# Patient Record
Sex: Female | Born: 1950 | Race: White | Marital: Married
Health system: Southern US, Community
[De-identification: ages and names within clinical notes are randomized; demographics above are authoritative.]

---

## 2019-09-24 ENCOUNTER — Other Ambulatory Visit: Payer: Self-pay

## 2019-09-24 DIAGNOSIS — Z20822 Contact with and (suspected) exposure to covid-19: Secondary | ICD-10-CM

## 2019-09-25 LAB — NOVEL CORONAVIRUS, NAA: SARS-CoV-2, NAA: NOT DETECTED

## 2019-12-04 ENCOUNTER — Other Ambulatory Visit: Payer: Self-pay

## 2019-12-04 DIAGNOSIS — Z20822 Contact with and (suspected) exposure to covid-19: Secondary | ICD-10-CM

## 2019-12-05 LAB — NOVEL CORONAVIRUS, NAA: SARS-CoV-2, NAA: NOT DETECTED

## 2019-12-09 ENCOUNTER — Telehealth: Payer: Self-pay | Admitting: *Deleted

## 2019-12-09 NOTE — Telephone Encounter (Signed)
Reviewed negative Covid19 results with the patient. No further questions. 

## 2020-09-06 ENCOUNTER — Ambulatory Visit
Admission: RE | Admit: 2020-09-06 | Discharge: 2020-09-06 | Disposition: A | Discharge status: Home / Self Care | Payer: Medicare Other | Source: Ambulatory Visit | Attending: Family Medicine | Admitting: Family Medicine

## 2020-09-06 ENCOUNTER — Other Ambulatory Visit: Payer: Self-pay | Admitting: Family Medicine

## 2020-09-06 DIAGNOSIS — M25531 Pain in right wrist: Secondary | ICD-10-CM

## 2021-01-05 DIAGNOSIS — Z833 Family history of diabetes mellitus: Secondary | ICD-10-CM | POA: Diagnosis not present

## 2021-01-05 DIAGNOSIS — Z1211 Encounter for screening for malignant neoplasm of colon: Secondary | ICD-10-CM | POA: Diagnosis not present

## 2021-01-05 DIAGNOSIS — Z79899 Other long term (current) drug therapy: Secondary | ICD-10-CM | POA: Diagnosis not present

## 2021-01-05 DIAGNOSIS — E7800 Pure hypercholesterolemia, unspecified: Secondary | ICD-10-CM | POA: Diagnosis not present

## 2021-01-05 DIAGNOSIS — D692 Other nonthrombocytopenic purpura: Secondary | ICD-10-CM | POA: Diagnosis not present

## 2021-01-05 DIAGNOSIS — I10 Essential (primary) hypertension: Secondary | ICD-10-CM | POA: Diagnosis not present

## 2021-01-05 DIAGNOSIS — R7301 Impaired fasting glucose: Secondary | ICD-10-CM | POA: Diagnosis not present

## 2021-01-05 DIAGNOSIS — E559 Vitamin D deficiency, unspecified: Secondary | ICD-10-CM | POA: Diagnosis not present

## 2021-01-05 DIAGNOSIS — Z0000 Encounter for general adult medical examination without abnormal findings: Secondary | ICD-10-CM | POA: Diagnosis not present

## 2021-02-08 DIAGNOSIS — Z1211 Encounter for screening for malignant neoplasm of colon: Secondary | ICD-10-CM | POA: Diagnosis not present

## 2021-05-25 DIAGNOSIS — R509 Fever, unspecified: Secondary | ICD-10-CM | POA: Diagnosis not present

## 2021-05-25 DIAGNOSIS — J988 Other specified respiratory disorders: Secondary | ICD-10-CM | POA: Diagnosis not present

## 2021-05-26 DIAGNOSIS — Z20822 Contact with and (suspected) exposure to covid-19: Secondary | ICD-10-CM | POA: Diagnosis not present

## 2021-05-26 DIAGNOSIS — R509 Fever, unspecified: Secondary | ICD-10-CM | POA: Diagnosis not present

## 2021-06-14 DIAGNOSIS — N3000 Acute cystitis without hematuria: Secondary | ICD-10-CM | POA: Diagnosis not present

## 2021-06-14 DIAGNOSIS — R5383 Other fatigue: Secondary | ICD-10-CM | POA: Diagnosis not present

## 2021-06-30 DIAGNOSIS — R5383 Other fatigue: Secondary | ICD-10-CM | POA: Diagnosis not present

## 2021-06-30 DIAGNOSIS — N3000 Acute cystitis without hematuria: Secondary | ICD-10-CM | POA: Diagnosis not present

## 2021-10-05 DIAGNOSIS — H5213 Myopia, bilateral: Secondary | ICD-10-CM | POA: Diagnosis not present

## 2021-10-05 DIAGNOSIS — H25013 Cortical age-related cataract, bilateral: Secondary | ICD-10-CM | POA: Diagnosis not present

## 2021-10-05 DIAGNOSIS — H524 Presbyopia: Secondary | ICD-10-CM | POA: Diagnosis not present

## 2021-10-12 DIAGNOSIS — I10 Essential (primary) hypertension: Secondary | ICD-10-CM | POA: Diagnosis not present

## 2021-12-11 DIAGNOSIS — J069 Acute upper respiratory infection, unspecified: Secondary | ICD-10-CM | POA: Diagnosis not present

## 2021-12-11 DIAGNOSIS — U071 COVID-19: Secondary | ICD-10-CM | POA: Diagnosis not present

## 2021-12-26 DIAGNOSIS — Z20822 Contact with and (suspected) exposure to covid-19: Secondary | ICD-10-CM | POA: Diagnosis not present

## 2022-01-19 DIAGNOSIS — J0140 Acute pansinusitis, unspecified: Secondary | ICD-10-CM | POA: Diagnosis not present

## 2022-02-20 DIAGNOSIS — Z1231 Encounter for screening mammogram for malignant neoplasm of breast: Secondary | ICD-10-CM | POA: Diagnosis not present

## 2022-02-20 DIAGNOSIS — Z780 Asymptomatic menopausal state: Secondary | ICD-10-CM | POA: Diagnosis not present

## 2022-05-25 DIAGNOSIS — D649 Anemia, unspecified: Secondary | ICD-10-CM | POA: Diagnosis not present

## 2022-05-25 DIAGNOSIS — Z79899 Other long term (current) drug therapy: Secondary | ICD-10-CM | POA: Diagnosis not present

## 2022-05-25 DIAGNOSIS — Z0000 Encounter for general adult medical examination without abnormal findings: Secondary | ICD-10-CM | POA: Diagnosis not present

## 2022-05-25 DIAGNOSIS — M545 Low back pain, unspecified: Secondary | ICD-10-CM | POA: Diagnosis not present

## 2022-05-25 DIAGNOSIS — R7303 Prediabetes: Secondary | ICD-10-CM | POA: Diagnosis not present

## 2022-05-25 DIAGNOSIS — Z1211 Encounter for screening for malignant neoplasm of colon: Secondary | ICD-10-CM | POA: Diagnosis not present

## 2022-05-25 DIAGNOSIS — E559 Vitamin D deficiency, unspecified: Secondary | ICD-10-CM | POA: Diagnosis not present

## 2022-05-25 DIAGNOSIS — I10 Essential (primary) hypertension: Secondary | ICD-10-CM | POA: Diagnosis not present

## 2022-05-25 DIAGNOSIS — E7800 Pure hypercholesterolemia, unspecified: Secondary | ICD-10-CM | POA: Diagnosis not present

## 2022-05-25 DIAGNOSIS — N3281 Overactive bladder: Secondary | ICD-10-CM | POA: Diagnosis not present

## 2022-06-20 DIAGNOSIS — J0100 Acute maxillary sinusitis, unspecified: Secondary | ICD-10-CM | POA: Diagnosis not present

## 2022-07-13 IMAGING — CR DG WRIST COMPLETE 3+V*R*
4 series · 4 of 4 positions shown · non-contrast
Comparison: None.

CLINICAL DATA: Pain.  Injury 2 months prior

EXAM:
RIGHT WRIST - COMPLETE 3+ VIEW

[x wrist pa right]
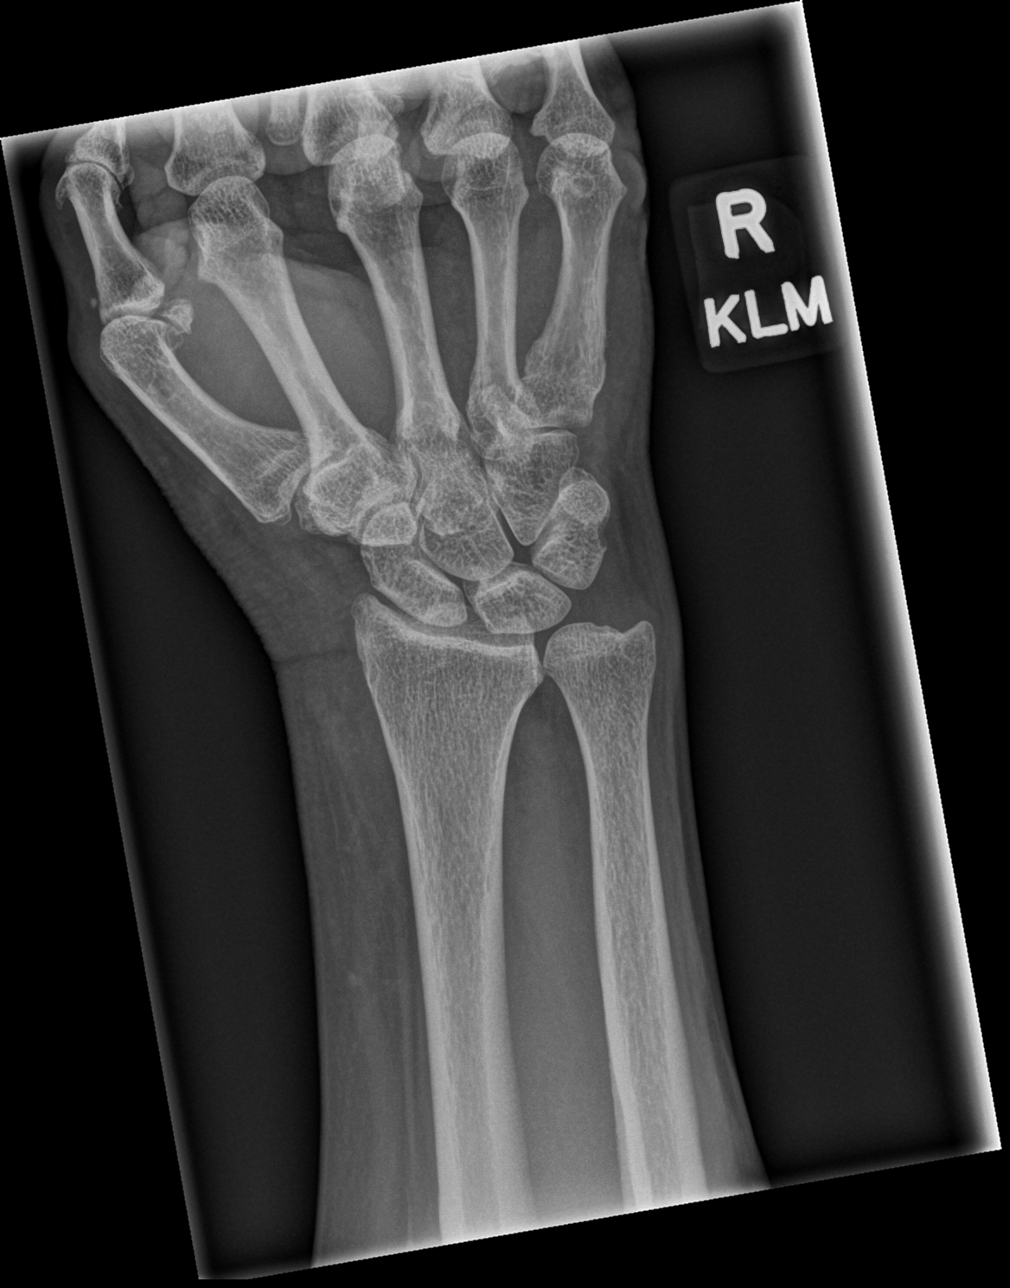

[x wrist obl right]
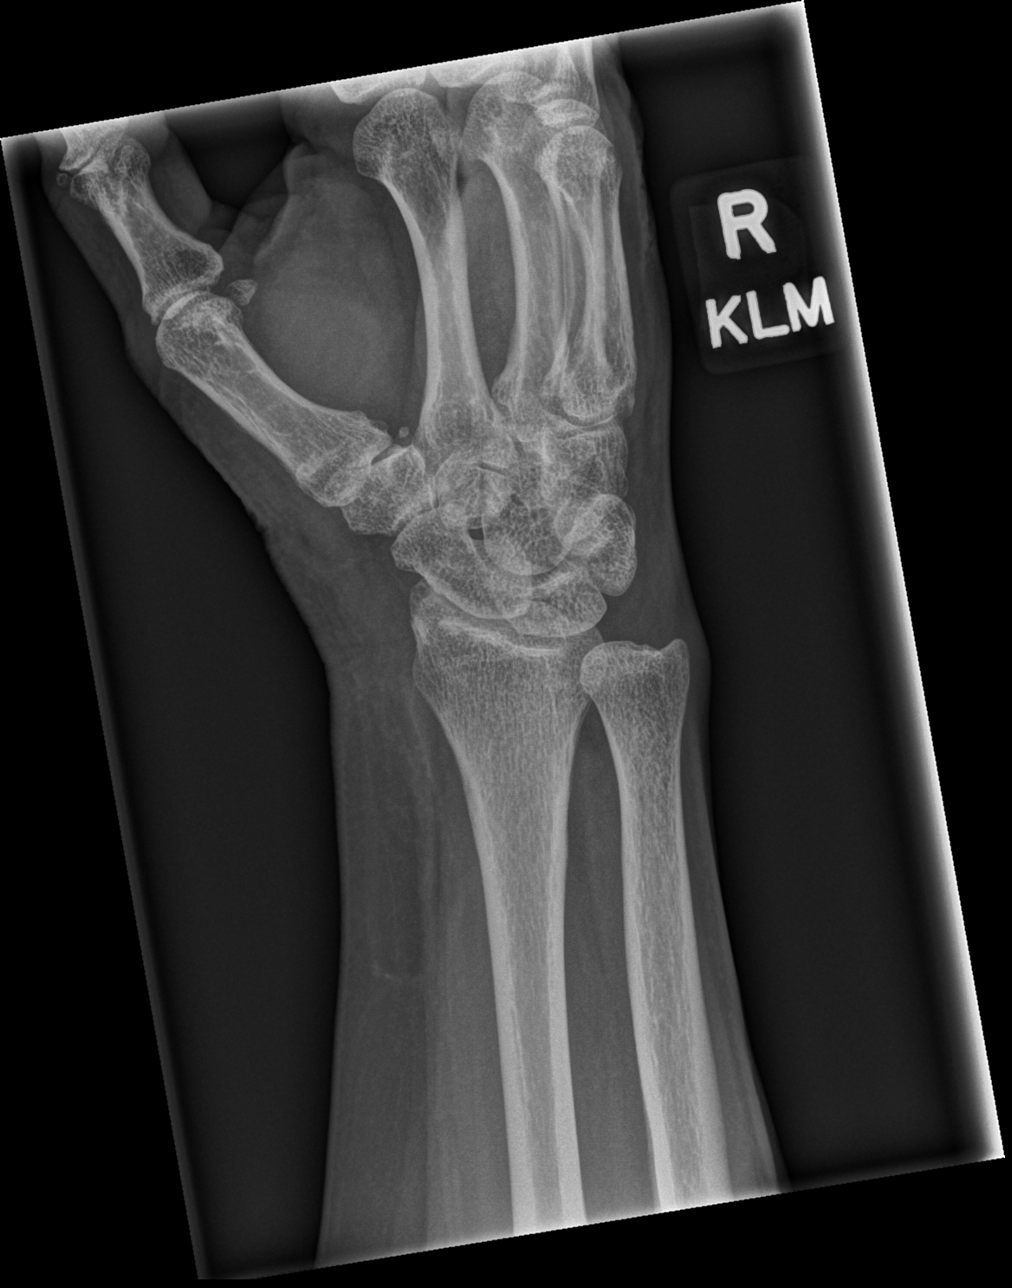

[x wrist lat right (1 of 2)]
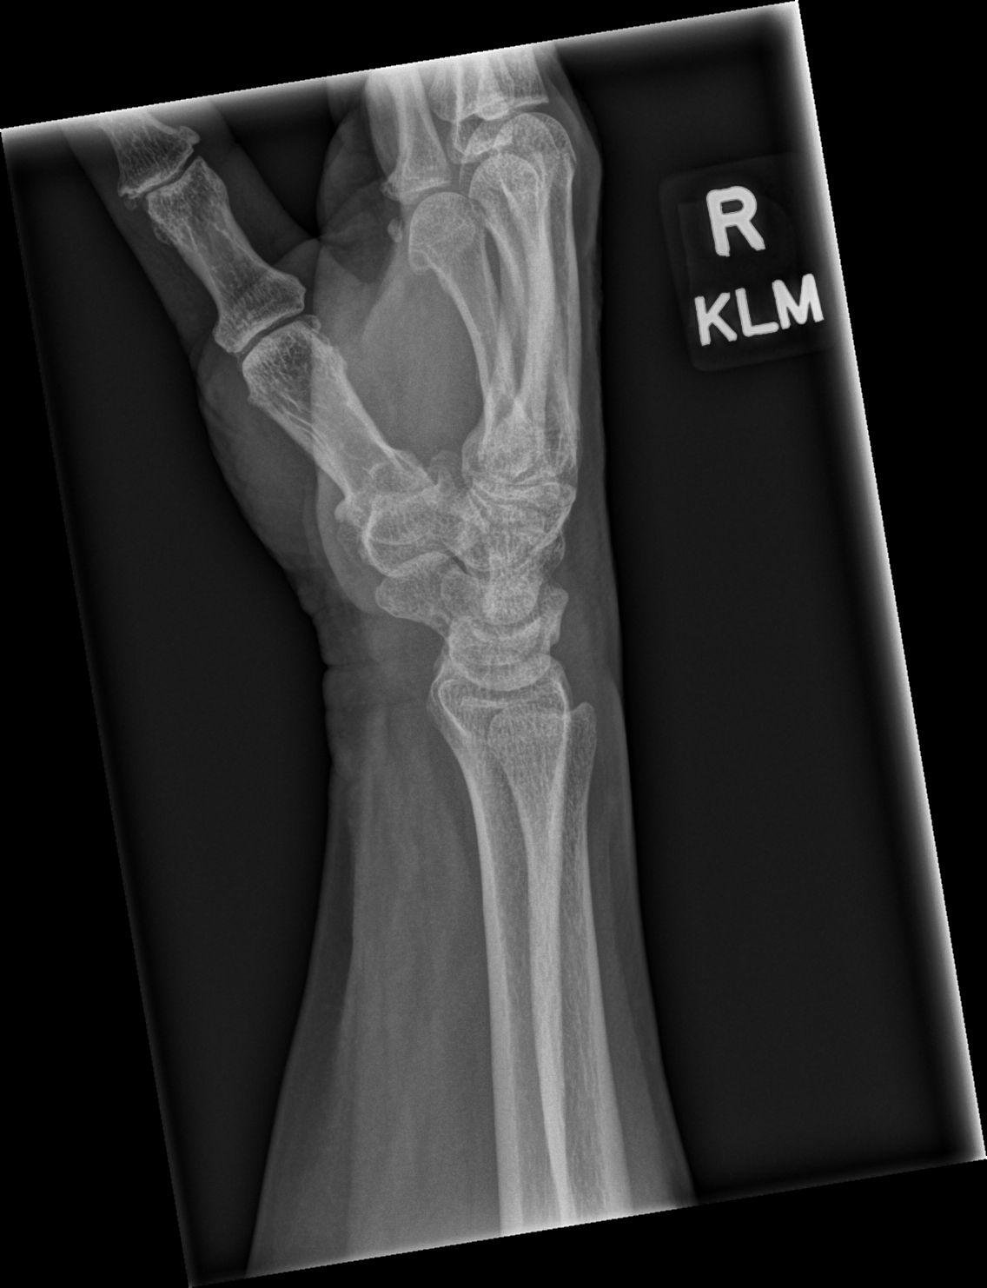

[x wrist lat right (2 of 2)]
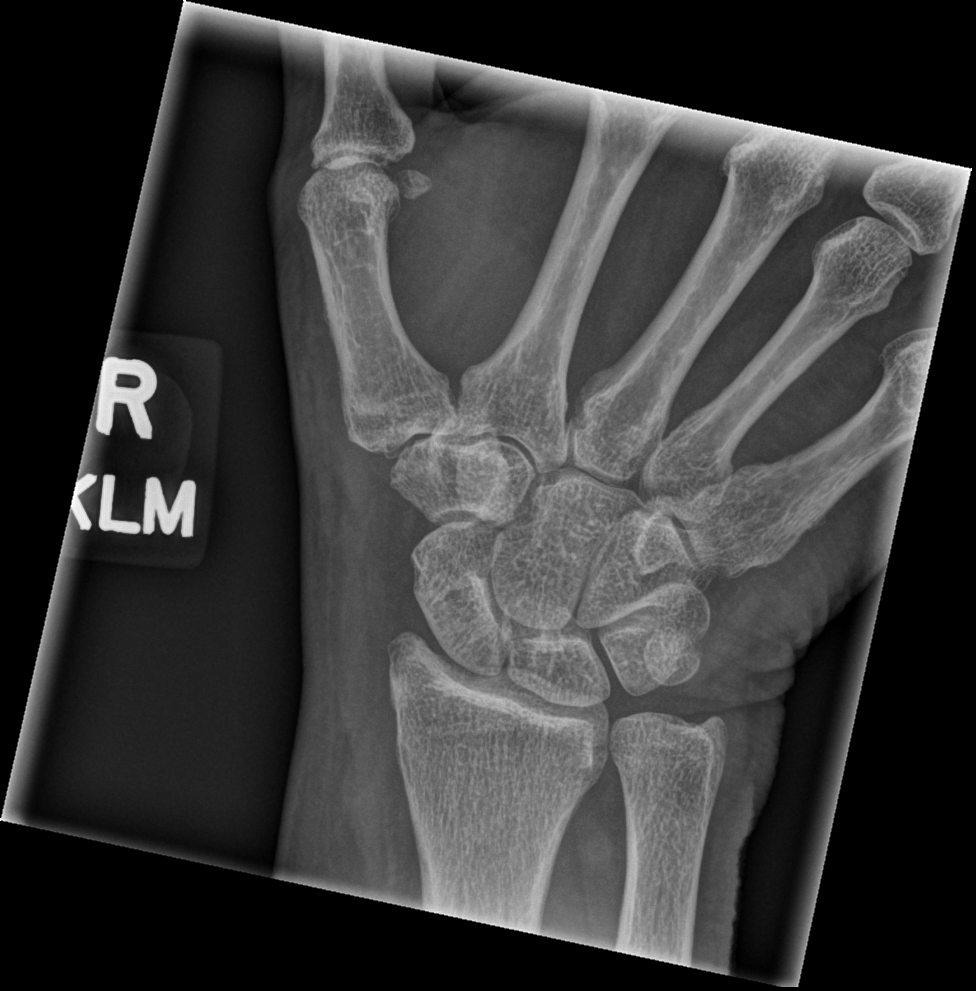

[4 of 4 positions shown; findings below may reference images not displayed]

FINDINGS: Frontal, oblique, lateral, and ulnar deviation scaphoid images were
obtained. No evident fracture or dislocation. The joint spaces
appear normal. No erosive change.
IMPRESSION: No fracture or dislocation.  No appreciable arthropathic change.

## 2022-10-05 DIAGNOSIS — H5213 Myopia, bilateral: Secondary | ICD-10-CM | POA: Diagnosis not present

## 2022-10-05 DIAGNOSIS — H524 Presbyopia: Secondary | ICD-10-CM | POA: Diagnosis not present

## 2022-10-05 DIAGNOSIS — H2513 Age-related nuclear cataract, bilateral: Secondary | ICD-10-CM | POA: Diagnosis not present

## 2022-11-01 DIAGNOSIS — N3000 Acute cystitis without hematuria: Secondary | ICD-10-CM | POA: Diagnosis not present

## 2022-11-01 DIAGNOSIS — Z23 Encounter for immunization: Secondary | ICD-10-CM | POA: Diagnosis not present

## 2022-11-20 DIAGNOSIS — N3000 Acute cystitis without hematuria: Secondary | ICD-10-CM | POA: Diagnosis not present

## 2023-04-09 DIAGNOSIS — Z1231 Encounter for screening mammogram for malignant neoplasm of breast: Secondary | ICD-10-CM | POA: Diagnosis not present

## 2023-06-21 DIAGNOSIS — D649 Anemia, unspecified: Secondary | ICD-10-CM | POA: Diagnosis not present

## 2023-06-21 DIAGNOSIS — Z79899 Other long term (current) drug therapy: Secondary | ICD-10-CM | POA: Diagnosis not present

## 2023-06-21 DIAGNOSIS — E559 Vitamin D deficiency, unspecified: Secondary | ICD-10-CM | POA: Diagnosis not present

## 2023-06-21 DIAGNOSIS — R7303 Prediabetes: Secondary | ICD-10-CM | POA: Diagnosis not present

## 2023-06-21 DIAGNOSIS — Z0000 Encounter for general adult medical examination without abnormal findings: Secondary | ICD-10-CM | POA: Diagnosis not present

## 2023-06-21 DIAGNOSIS — Z1211 Encounter for screening for malignant neoplasm of colon: Secondary | ICD-10-CM | POA: Diagnosis not present

## 2023-06-21 DIAGNOSIS — H6123 Impacted cerumen, bilateral: Secondary | ICD-10-CM | POA: Diagnosis not present

## 2023-06-21 DIAGNOSIS — N1831 Chronic kidney disease, stage 3a: Secondary | ICD-10-CM | POA: Diagnosis not present

## 2023-06-21 DIAGNOSIS — E7800 Pure hypercholesterolemia, unspecified: Secondary | ICD-10-CM | POA: Diagnosis not present

## 2023-08-09 DIAGNOSIS — R519 Headache, unspecified: Secondary | ICD-10-CM | POA: Diagnosis not present

## 2023-08-09 DIAGNOSIS — Z03818 Encounter for observation for suspected exposure to other biological agents ruled out: Secondary | ICD-10-CM | POA: Diagnosis not present

## 2023-08-09 DIAGNOSIS — R051 Acute cough: Secondary | ICD-10-CM | POA: Diagnosis not present

## 2023-11-28 DIAGNOSIS — Z23 Encounter for immunization: Secondary | ICD-10-CM | POA: Diagnosis not present

## 2024-01-09 DIAGNOSIS — H5213 Myopia, bilateral: Secondary | ICD-10-CM | POA: Diagnosis not present

## 2024-01-09 DIAGNOSIS — H25013 Cortical age-related cataract, bilateral: Secondary | ICD-10-CM | POA: Diagnosis not present

## 2024-01-09 DIAGNOSIS — H2513 Age-related nuclear cataract, bilateral: Secondary | ICD-10-CM | POA: Diagnosis not present

## 2024-06-09 DIAGNOSIS — E559 Vitamin D deficiency, unspecified: Secondary | ICD-10-CM | POA: Diagnosis not present

## 2024-06-09 DIAGNOSIS — D649 Anemia, unspecified: Secondary | ICD-10-CM | POA: Diagnosis not present

## 2024-06-09 DIAGNOSIS — E7800 Pure hypercholesterolemia, unspecified: Secondary | ICD-10-CM | POA: Diagnosis not present

## 2024-06-09 DIAGNOSIS — Z23 Encounter for immunization: Secondary | ICD-10-CM | POA: Diagnosis not present

## 2024-06-09 DIAGNOSIS — Z79899 Other long term (current) drug therapy: Secondary | ICD-10-CM | POA: Diagnosis not present

## 2024-06-09 DIAGNOSIS — N1831 Chronic kidney disease, stage 3a: Secondary | ICD-10-CM | POA: Diagnosis not present

## 2024-06-09 DIAGNOSIS — Z0000 Encounter for general adult medical examination without abnormal findings: Secondary | ICD-10-CM | POA: Diagnosis not present

## 2024-06-09 DIAGNOSIS — R7303 Prediabetes: Secondary | ICD-10-CM | POA: Diagnosis not present

## 2024-06-18 DIAGNOSIS — L237 Allergic contact dermatitis due to plants, except food: Secondary | ICD-10-CM | POA: Diagnosis not present

## 2024-06-18 DIAGNOSIS — R030 Elevated blood-pressure reading, without diagnosis of hypertension: Secondary | ICD-10-CM | POA: Diagnosis not present

## 2024-09-02 DIAGNOSIS — Z23 Encounter for immunization: Secondary | ICD-10-CM | POA: Diagnosis not present

## 2024-10-20 NOTE — Progress Notes (Signed)
 Lindsey Miles                                          MRN: [id number]   10/20/2024   The [facility] Quality Team Specialist reviewed this patient medical record for the purposes of chart review for care gap closure. The following were reviewed: chart review for care gap closure-colorectal cancer screening.    [facility] Quality Team
